# Patient Record
Sex: Female | Born: 1980 | Race: White | Hispanic: No | Marital: Single | Smoking: Current every day smoker
Health system: Southern US, Community
[De-identification: ages and names within clinical notes are randomized; demographics above are authoritative.]

## PROBLEM LIST (undated history)

## (undated) HISTORY — PX: ABDOMINAL SURGERY: SHX537

---

## 2017-08-25 ENCOUNTER — Encounter: Payer: Self-pay | Admitting: Emergency Medicine

## 2017-08-25 ENCOUNTER — Emergency Department: Payer: Self-pay

## 2017-08-25 ENCOUNTER — Emergency Department
Admission: EM | Admit: 2017-08-25 | Discharge: 2017-08-25 | Disposition: A | Discharge status: Home / Self Care | Payer: Self-pay | Attending: Emergency Medicine | Admitting: Emergency Medicine

## 2017-08-25 ENCOUNTER — Other Ambulatory Visit: Payer: Self-pay

## 2017-08-25 DIAGNOSIS — W2107XA Struck by softball, initial encounter: Secondary | ICD-10-CM | POA: Insufficient documentation

## 2017-08-25 DIAGNOSIS — Y9389 Activity, other specified: Secondary | ICD-10-CM | POA: Insufficient documentation

## 2017-08-25 DIAGNOSIS — S060X1A Concussion with loss of consciousness of 30 minutes or less, initial encounter: Secondary | ICD-10-CM | POA: Insufficient documentation

## 2017-08-25 DIAGNOSIS — Y998 Other external cause status: Secondary | ICD-10-CM | POA: Insufficient documentation

## 2017-08-25 DIAGNOSIS — Y92007 Garden or yard of unspecified non-institutional (private) residence as the place of occurrence of the external cause: Secondary | ICD-10-CM | POA: Insufficient documentation

## 2017-08-25 DIAGNOSIS — F17210 Nicotine dependence, cigarettes, uncomplicated: Secondary | ICD-10-CM | POA: Insufficient documentation

## 2017-08-25 MED ORDER — ONDANSETRON 4 MG PO TBDP: 4.0000 mg | ORAL_TABLET | Freq: Three times a day (TID) | ORAL | 0 refills | Status: AC | PRN

## 2017-08-25 MED ORDER — ONDANSETRON 4 MG PO TBDP
4.0000 mg | ORAL_TABLET | Freq: Once | ORAL | Status: AC
  Administered 2017-08-25: 4 mg via ORAL
  Filled 2017-08-25: qty 1

## 2017-08-25 NOTE — ED Provider Notes (Signed)
[facility] Emergency Department Provider Note       Time seen: ----------------------------------------- 12:39 PM on 08/25/2017 -----------------------------------------   I have reviewed the triage vital signs and the nursing notes.  HISTORY   Chief Complaint Headache; Nausea; and Head Injury    HPI Angelica Simmons is a 37 y.o. female with no significant past medical history who presents to the ED for a head injury.  Patient states she was walking through her yard yesterday evening was hit in the back of the head with a fast pitch softball.  She states her girls were throwing a softball and one actually hit her in the back of the head.  She denies any laceration or bleeding she did have loss of consciousness with the initial event.  She has had some nausea but no other associated symptoms.  History reviewed. No pertinent past medical history.  There are no active problems to display for this patient.   Past Surgical History:  Procedure Laterality Date  . ABDOMINAL SURGERY     C SEction    Allergies Patient has no known allergies.  Social History Social History   Tobacco Use  . Smoking status: Current Every Day Smoker    Packs/day: 1.00    Types: Cigarettes  . Smokeless tobacco: Never Used  Substance Use Topics  . Alcohol use: Yes    Comment: occas.   . Drug use: Not on file   Review of Systems Constitutional: Negative for fever. Eyes: Negative for vision changes Cardiovascular: Negative for chest pain. Respiratory: Negative for shortness of breath. Gastrointestinal: Negative for abdominal pain, positive for nausea Musculoskeletal: Negative for back pain. Skin: Negative for rash. Neurological: Positive for headache  All systems negative/normal/unremarkable except as stated in the HPI  ____________________________________________   PHYSICAL EXAM:  VITAL SIGNS: ED Triage Vitals  Enc Vitals Group     BP 08/25/17 1016 131/72      Pulse Rate 08/25/17 1016 94     Resp 08/25/17 1016 20     Temp 08/25/17 1016 98.7 F (37.1 C)     Temp Source 08/25/17 1016 Oral     SpO2 08/25/17 1016 97 %     Weight 08/25/17 1017 (!) 320 lb (145.2 kg)     Height 08/25/17 1017 5\' 4"  (1.626 m)     Head Circumference --      Peak Flow --      Pain Score 08/25/17 1021 6     Pain Loc --      Pain Edu? --      Excl. in GC? --    Constitutional: Alert and oriented. Well appearing and in no distress. Eyes: Conjunctivae are normal. Normal extraocular movements. ENT   Head: Normocephalic and atraumatic.   Nose: No congestion/rhinnorhea.   Mouth/Throat: Mucous membranes are moist.   Neck: No stridor. Cardiovascular: Normal rate, regular rhythm. No murmurs, rubs, or gallops. Respiratory: Normal respiratory effort without tachypnea nor retractions. Breath sounds are clear and equal bilaterally. No wheezes/rales/rhonchi. Gastrointestinal: Soft and nontender. Normal bowel sounds Musculoskeletal: Nontender with normal range of motion in extremities. No lower extremity tenderness nor edema. Neurologic:  Normal speech and language. No gross focal neurologic deficits are appreciated.  Strength, sensation, cranial nerves are normal Skin:  Skin is warm, dry and intact. No rash noted. Psychiatric: Mood and affect are normal. Speech and behavior are normal.  ____________________________________________  ED COURSE:  As part of my medical decision making, I reviewed the following data within the  electronic MEDICAL RECORD NUMBER History obtained from family if available, nursing notes, old chart and ekg, as well as notes from prior ED visits. Patient presented for head injury, we will assess with imaging as indicated at this time.   Procedures ____________________________________________    RADIOLOGY CT head is unremarkable  ____________________________________________  DIFFERENTIAL DIAGNOSIS   Contusion, concussion, minor head  injury  FINAL ASSESSMENT AND PLAN  Concussion   Plan: The patient had presented for symptoms of a concussion. Patient's imaging was negative for any acute process.  She is cleared for outpatient follow-up, given proper follow-up instructions.  Ulice Dash, MD   Note: This note was generated in part or whole with voice recognition software. Voice recognition is usually quite accurate but there are transcription errors that can and very often do occur. I apologize for any typographical errors that were not detected and corrected.     Emily Filbert, MD 08/25/17 1242

## 2017-08-25 NOTE — ED Triage Notes (Signed)
Pt states she was walking through her yard yesterday evening was hit in the back of the head with softball that her girls were throwing, denies lac or bleeding, states she has been nauseated since. Pt states area in the back of her head "aches." No knot or lac noted.

## 2017-08-25 NOTE — ED Notes (Signed)

## 2019-10-31 IMAGING — CT CT HEAD W/O CM
4 series · 17 of 47 positions shown, 19 images · non-contrast
Comparison: None.

CLINICAL DATA: Hit in head with softball with headaches, initial
encounter

EXAM:
CT HEAD WITHOUT CONTRAST
TECHNIQUE: Contiguous axial images were obtained from the base of the skull
through the vertex without intravenous contrast.

[Series 2: head wo · axial · 0.40mm/px · z∈[-122,-17]mm · 7 of 29 slices shown, 9 images]
[im 4/29  brain]
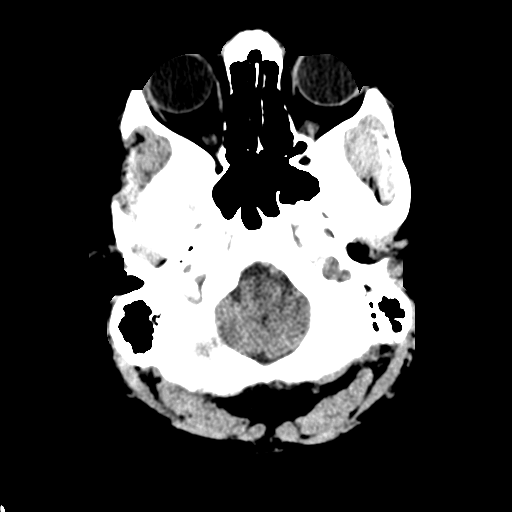
[im 4/29  bone]
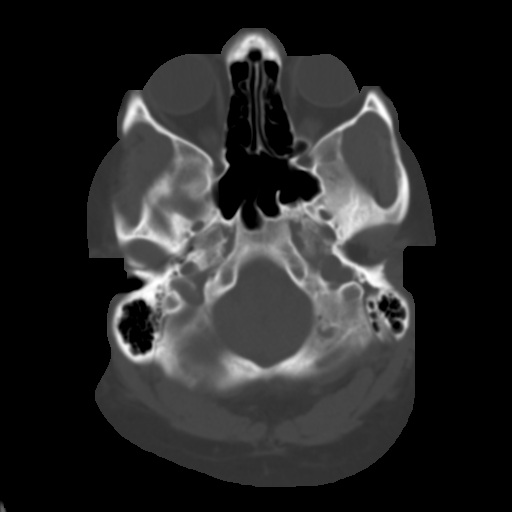
[im 8/29  brain]
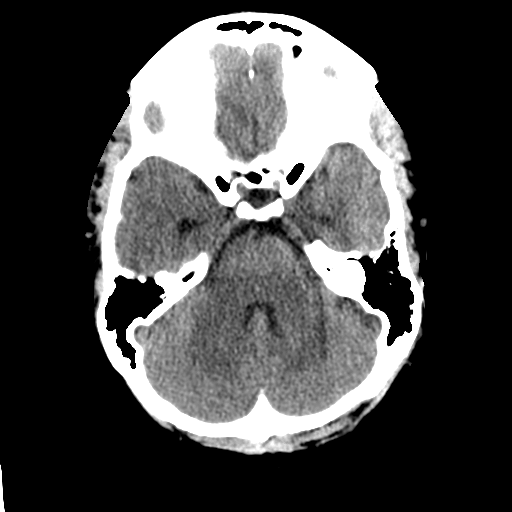
[im 11/29  brain]
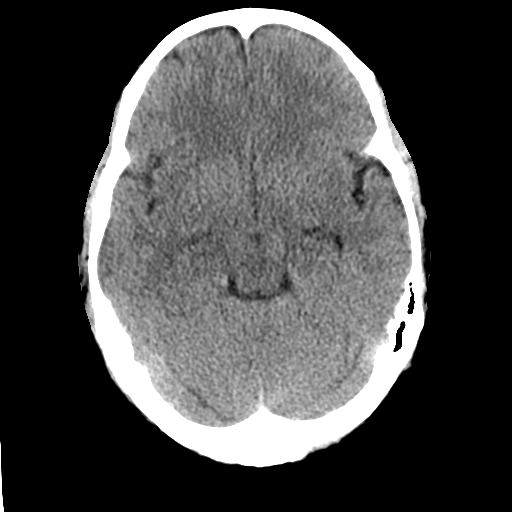
[im 15/29  brain]
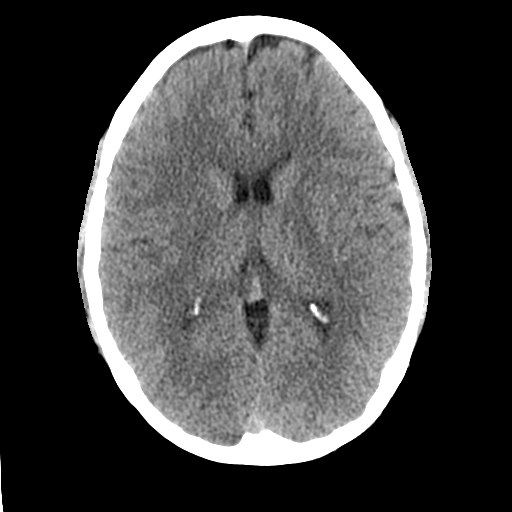
[im 18/29  brain]
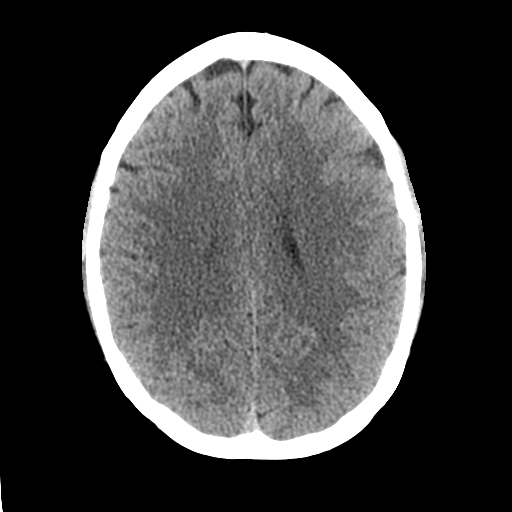
[im 18/29  bone]
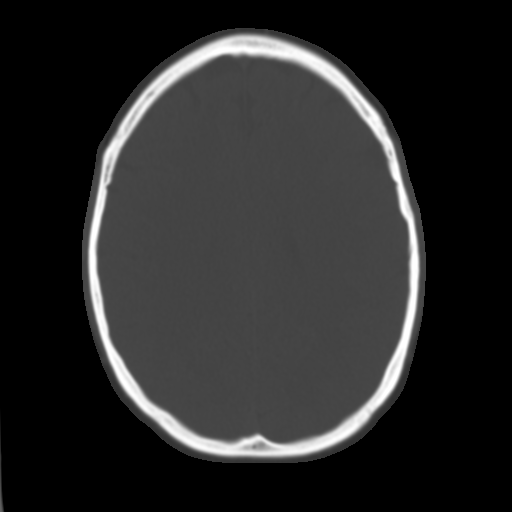
[im 22/29  brain]
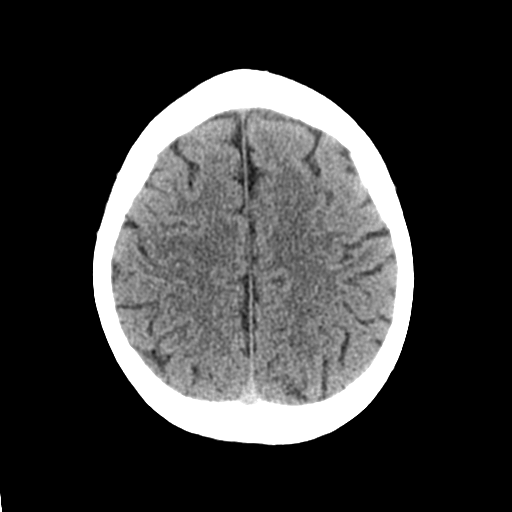
[im 25/29  brain]
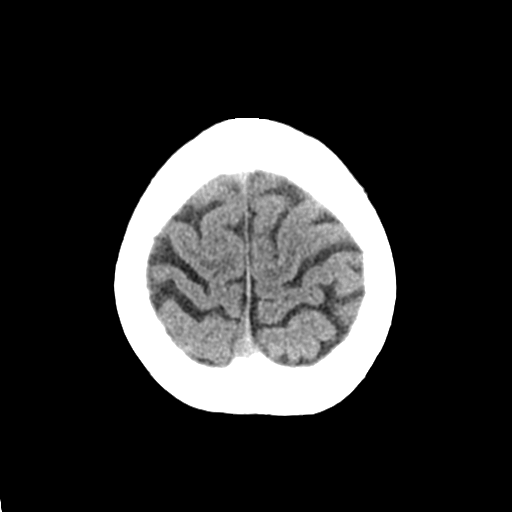

[Series 3: head bone · axial · 0.40mm/px · z∈[-123,-73]mm · 4 of 73 slices shown]
[im 8/73  bone]
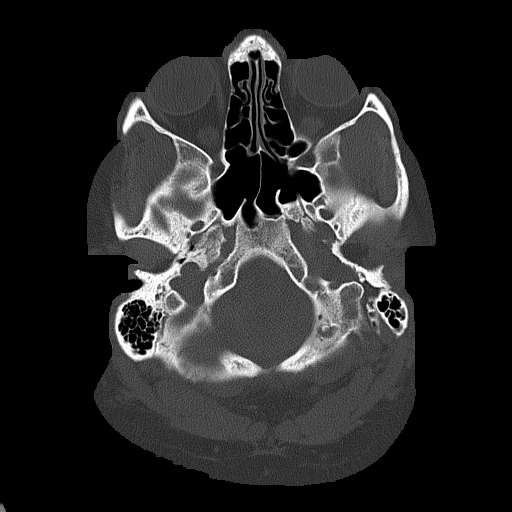
[im 15/73  bone]
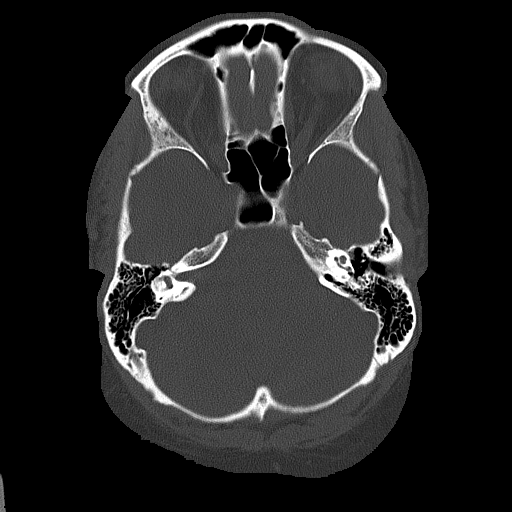
[im 22/73  bone]
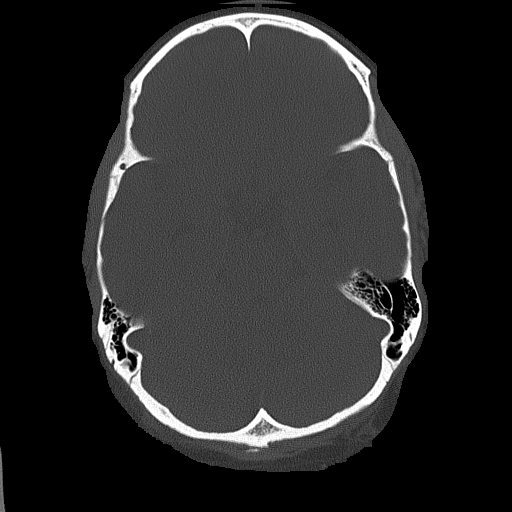
[im 33/73  bone]
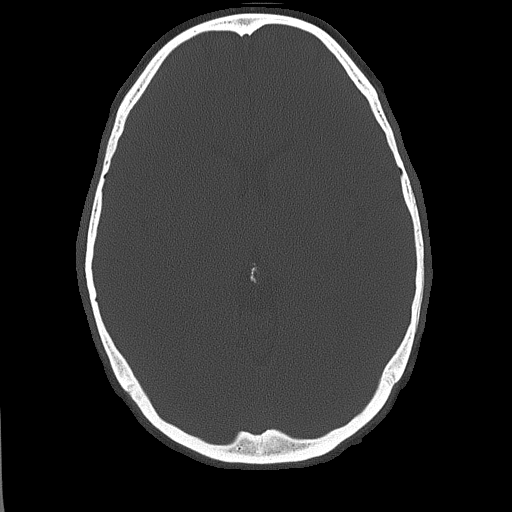

[Series 4: coronal soft tissue · coronal · 0.29mm/px · 3 of 63 slices shown]
[im 21/63  brain]
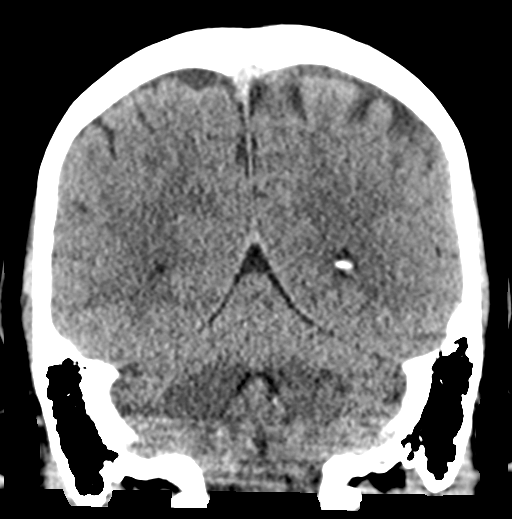
[im 28/63  brain]
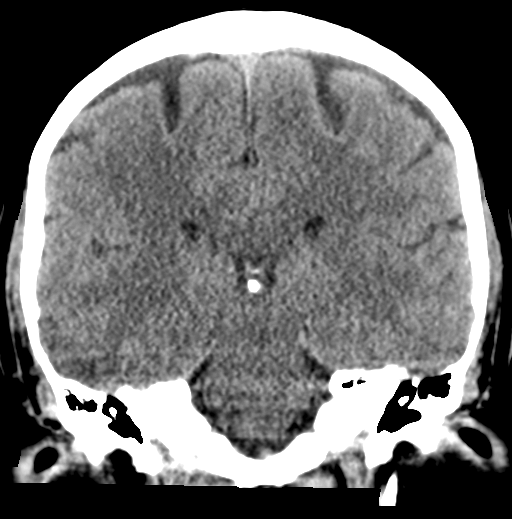
[im 35/63  brain]
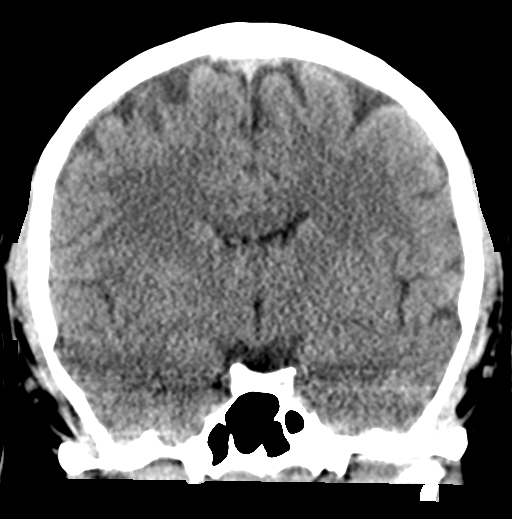

[Series 5: sagittal soft tissue · sagittal · 0.30mm/px · 3 of 51 slices shown]
[im 17/51  brain]
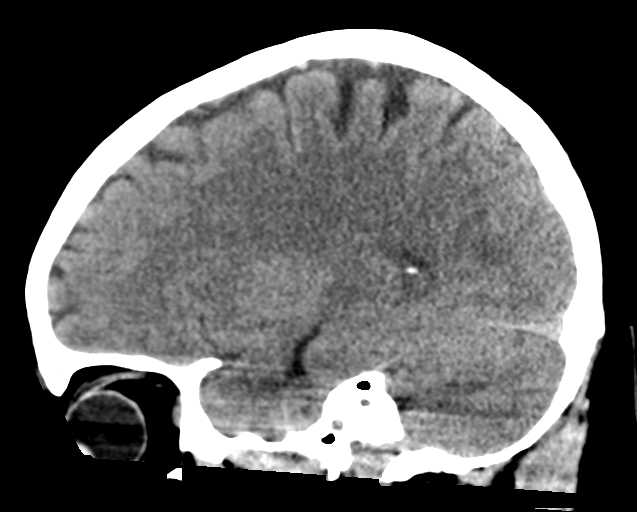
[im 26/51  brain]
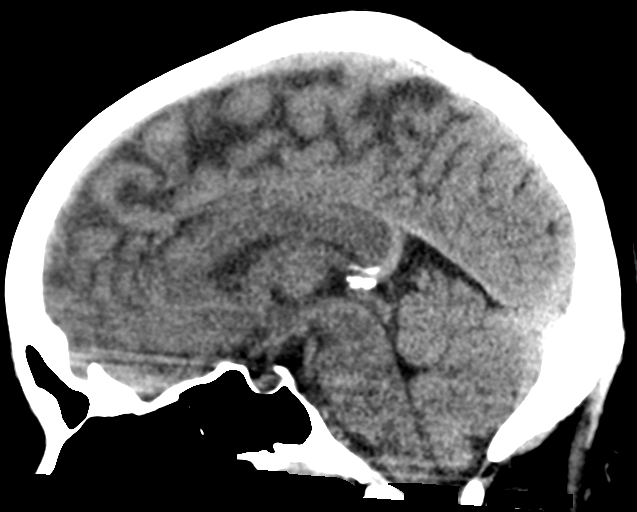
[im 34/51  brain]
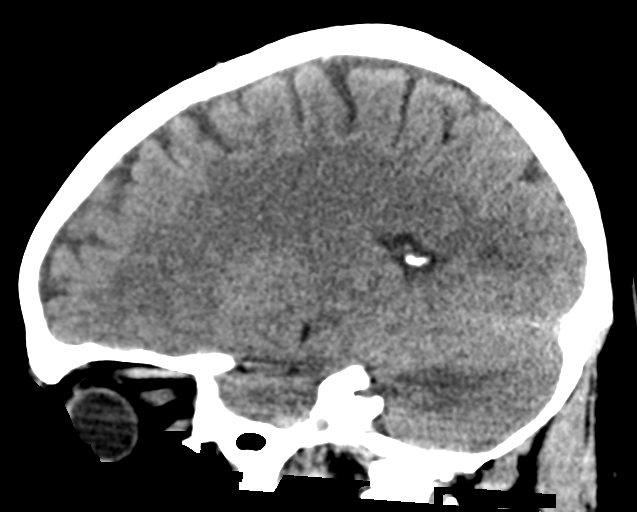

[17 of 47 positions shown; findings below may reference images not displayed]

FINDINGS: Brain: No evidence of acute infarction, hemorrhage, hydrocephalus,
extra-axial collection or mass lesion/mass effect.

Vascular: No hyperdense vessel or unexpected calcification.

Skull: Normal. Negative for fracture or focal lesion.

Sinuses/Orbits: No acute finding.

Other: None.
IMPRESSION: No acute abnormality noted.
